# Patient Record
Sex: Female | Born: 1995 | Race: Black or African American | Hispanic: No | Marital: Single | Smoking: Never smoker
Health system: Southern US, Community
[De-identification: ages and names within clinical notes are randomized; demographics above are authoritative.]

---
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
## 2016-06-15 IMAGING — DX DG CHEST 2V
2 series · 2 of 2 positions shown · non-contrast
Comparison: None.

CLINICAL DATA: 19-year-old female with acute chest pain for 2 days.

EXAM:
CHEST  2 VIEW

[chest pa]
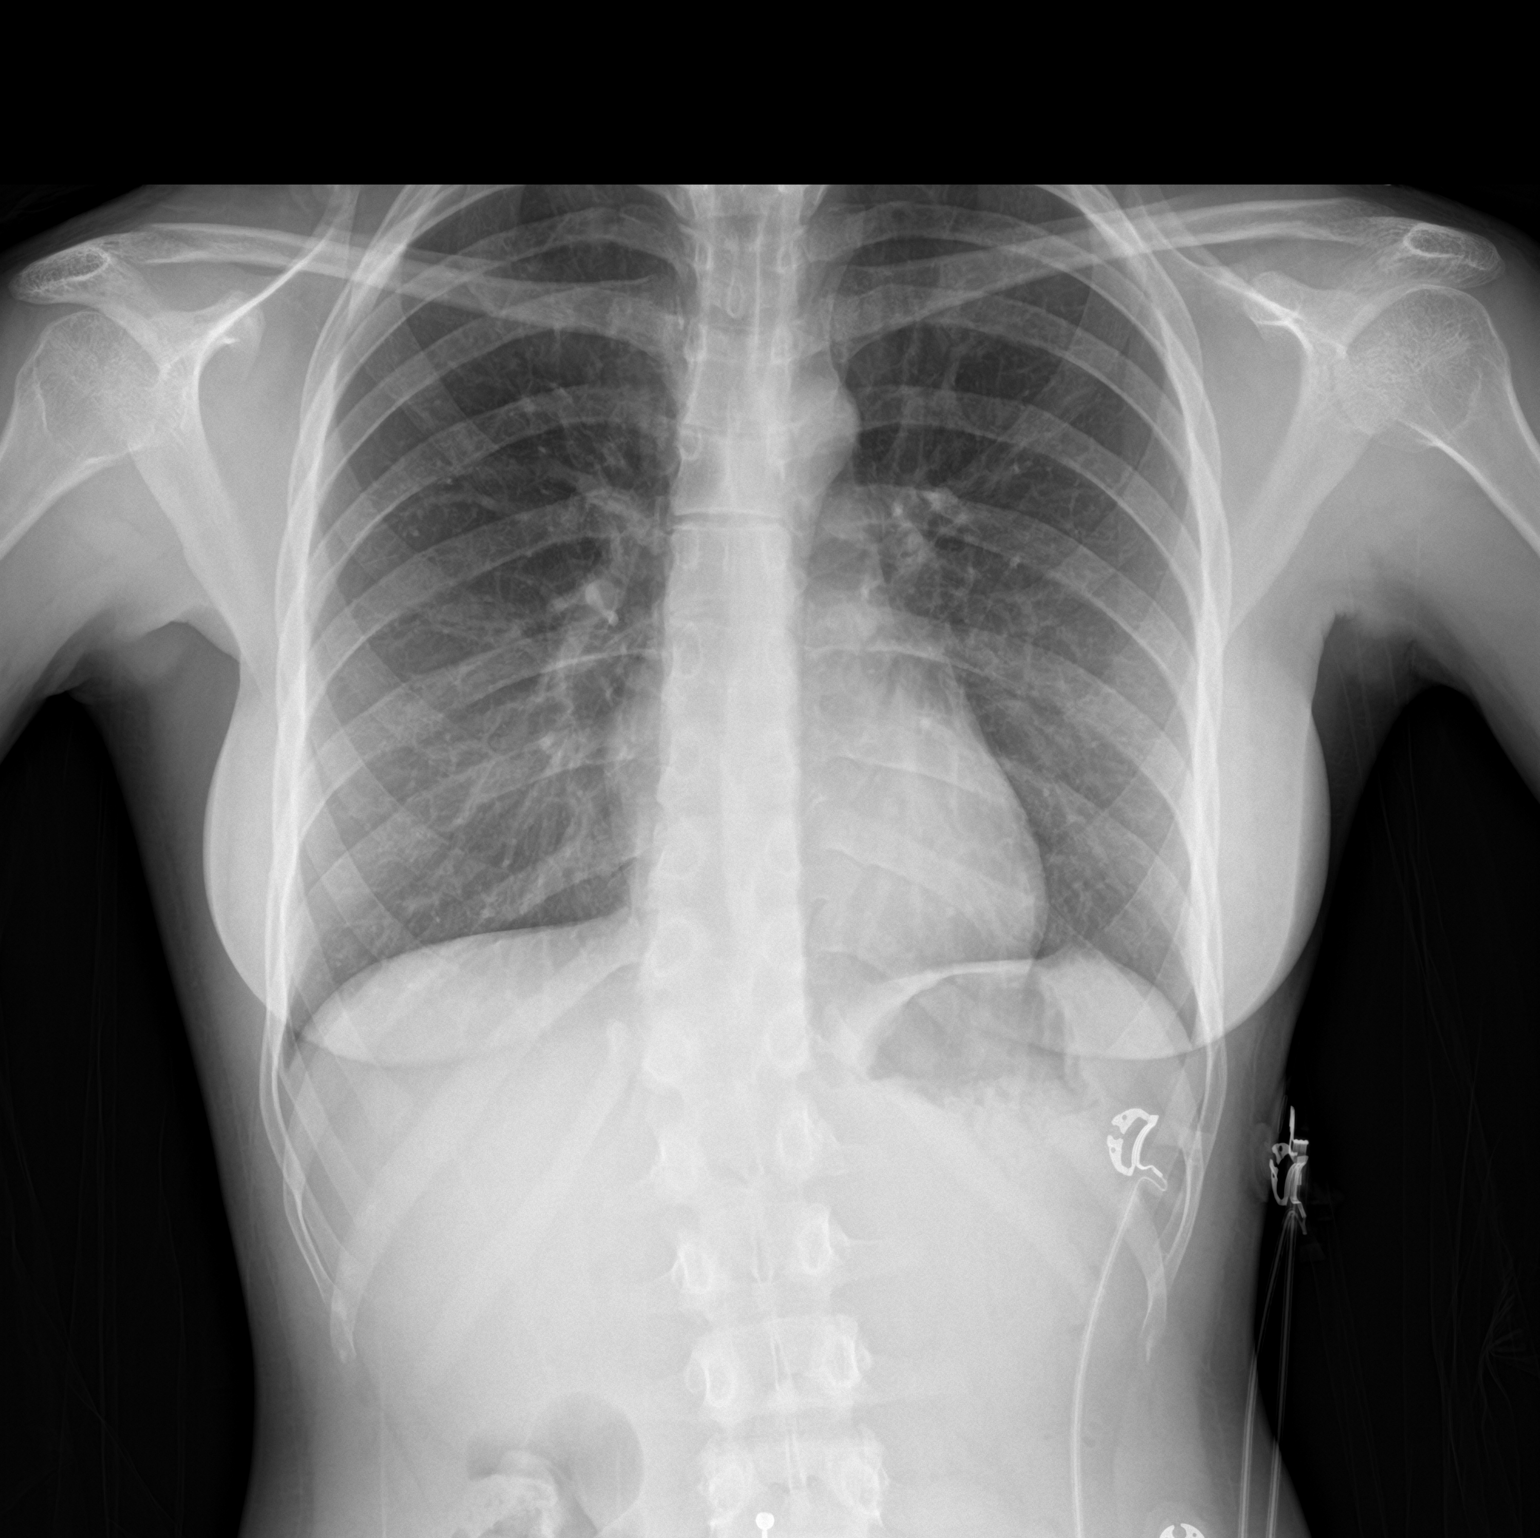

[chest lat]
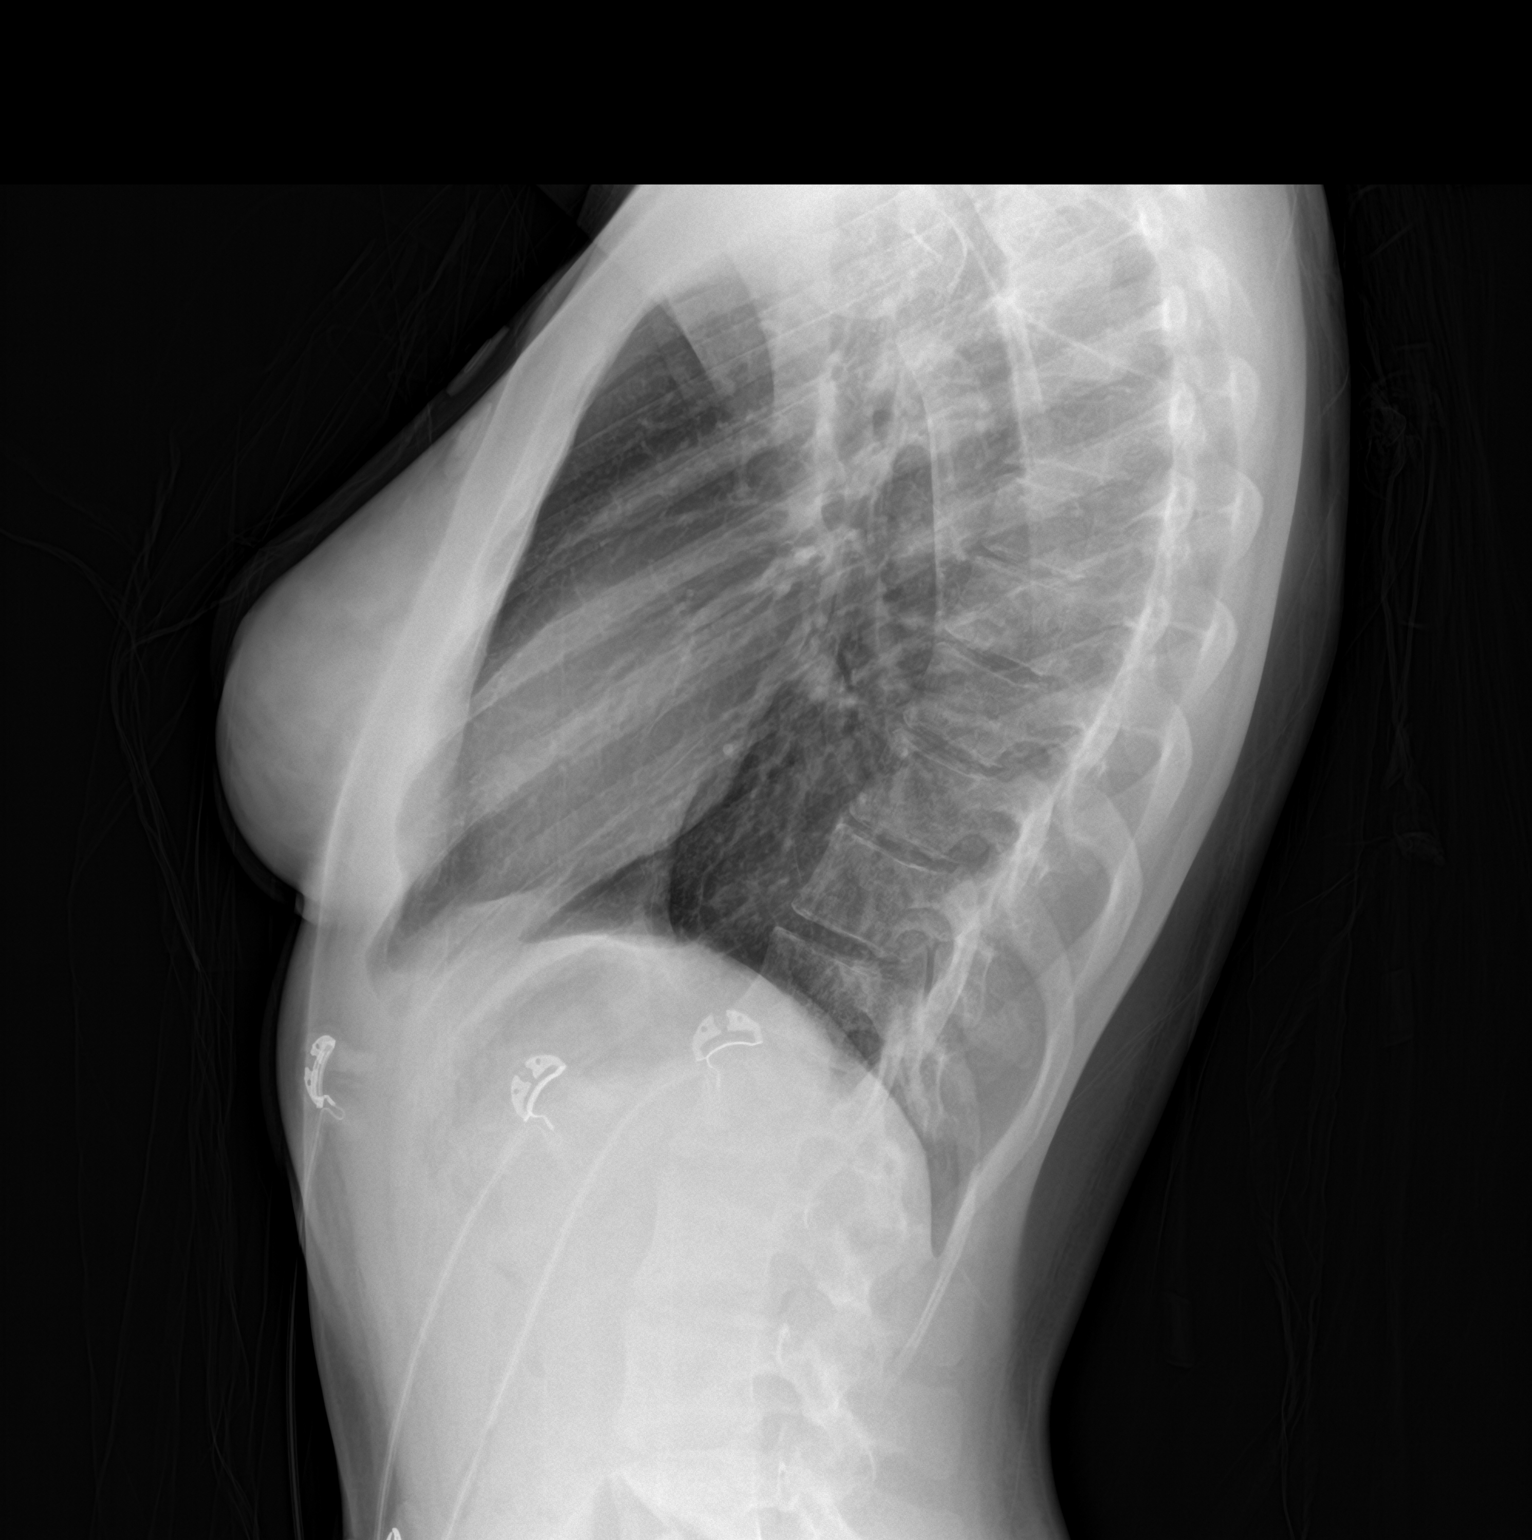

[2 of 2 positions shown; findings below may reference images not displayed]

FINDINGS: The cardiomediastinal silhouette is unremarkable.

There is no evidence of focal airspace disease, pulmonary edema,
suspicious pulmonary nodule/mass, pleural effusion, or pneumothorax.
No acute bony abnormalities are identified.
IMPRESSION: No active cardiopulmonary disease.

## 2016-10-11 ENCOUNTER — Encounter (HOSPITAL_COMMUNITY): Payer: Self-pay | Admitting: Emergency Medicine

## 2016-10-11 ENCOUNTER — Emergency Department (HOSPITAL_COMMUNITY)
Admission: EM | Admit: 2016-10-11 | Discharge: 2016-10-11 | Disposition: A | Discharge status: Home / Self Care | Payer: Managed Care, Other (non HMO) | Attending: Emergency Medicine | Admitting: Emergency Medicine

## 2016-10-11 DIAGNOSIS — J111 Influenza due to unidentified influenza virus with other respiratory manifestations: Secondary | ICD-10-CM | POA: Insufficient documentation

## 2016-10-11 DIAGNOSIS — R509 Fever, unspecified: Secondary | ICD-10-CM | POA: Diagnosis present

## 2016-10-11 DIAGNOSIS — R69 Illness, unspecified: Secondary | ICD-10-CM

## 2016-10-11 LAB — POC URINE PREG, ED: Preg Test, Ur: NEGATIVE

## 2016-10-11 LAB — RAPID STREP SCREEN (MED CTR MEBANE ONLY): STREPTOCOCCUS, GROUP A SCREEN (DIRECT): NEGATIVE

## 2016-10-11 MED ORDER — NAPROXEN 250 MG PO TABS: 250.0000 mg | ORAL_TABLET | Freq: Two times a day (BID) | ORAL | 0 refills | Status: DC

## 2016-10-11 MED ORDER — ONDANSETRON 4 MG PO TBDP
4.0000 mg | ORAL_TABLET | Freq: Three times a day (TID) | ORAL | 0 refills | Status: AC | PRN
Start: 2016-10-11 — End: ?

## 2016-10-11 MED ORDER — ACETAMINOPHEN 325 MG PO TABS
650.0000 mg | ORAL_TABLET | Freq: Once | ORAL | Status: DC
  Filled 2016-10-11: qty 2

## 2016-10-11 MED ORDER — FLUTICASONE PROPIONATE 50 MCG/ACT NA SUSP: 2.0000 | Freq: Every day | NASAL | 0 refills | Status: DC

## 2016-10-11 MED ORDER — BENZONATATE 100 MG PO CAPS: 100.0000 mg | ORAL_CAPSULE | Freq: Three times a day (TID) | ORAL | 0 refills | Status: DC | PRN

## 2016-10-11 MED ORDER — CETIRIZINE HCL 10 MG PO TABS: 10.0000 mg | ORAL_TABLET | Freq: Every day | ORAL | 1 refills | Status: DC

## 2016-10-11 MED ORDER — OSELTAMIVIR PHOSPHATE 75 MG PO CAPS: 75.0000 mg | ORAL_CAPSULE | Freq: Two times a day (BID) | ORAL | 0 refills | Status: DC

## 2016-10-11 NOTE — ED Provider Notes (Signed)
[facility]-EMERGENCY DEPT Provider Note   CSN: [id number] Arrival date & time: 10/11/16  0416     History   Chief Complaint Chief Complaint  Patient presents with  . Fever  . Sore Throat    HPI Rachel Spence is a 21 y.o. female.  Rachel Spence is a 21 y.o. Female who presents to the emergency department complaining of flulike symptoms since yesterday with nausea and vomiting beginning today. Patient reports subjective fever, chills, body aches, sore throat, nasal congestion, postnasal drip, sneezing and coughing. She reports today she developed some nausea and has vomited twice. She denies any abdominal pain. She's taken Mucinex earlier this morning. She denies taking any antipyretics prior to arrival. Patient tells me her vomiting seems to be worse in the morning and in the evening. She is sexually active and does not use protection. She is unsure of her last menstrual cycle she takes birth control and have regular menstrual cycles. Patient did not receive her flu shot this year. She denies trouble swallowing, neck pain, lightheadedness, syncope, rashes, urinary symptoms, abdominal pain, diarrhea, or shortness of breath.     Fever   Associated symptoms include vomiting, congestion, sore throat and cough. Pertinent negatives include no chest pain, no diarrhea and no headaches.  Sore Throat  Pertinent negatives include no chest pain, no abdominal pain, no headaches and no shortness of breath.    History reviewed. No pertinent past medical history.  There are no active problems to display for this patient.   History reviewed. No pertinent surgical history.  OB History    Gravida Para Term Preterm AB Living   0 0 0 0 0 0   SAB TAB Ectopic Multiple Live Births   0 0 0 0 0       Home Medications    Prior to Admission medications   Medication Sig Start Date End Date Taking? Authorizing Provider  benzonatate (TESSALON) 100 MG capsule Take 1 capsule (100 mg total) by mouth 3  (three) times daily as needed for cough. 10/11/16   Everlene FarrierWilliam Katelen Luepke, PA-C  cetirizine (ZYRTEC ALLERGY) 10 MG tablet Take 1 tablet (10 mg total) by mouth daily. 10/11/16   Everlene FarrierWilliam Cruze Zingaro, PA-C  fluticasone (FLONASE) 50 MCG/ACT nasal spray Place 2 sprays into both nostrils daily. 10/11/16   Everlene FarrierWilliam Corry Storie, PA-C  metroNIDAZOLE (FLAGYL) 500 MG tablet Take 500 mg by mouth 2 (two) times daily.    Historical Provider, MD  naproxen (NAPROSYN) 250 MG tablet Take 1 tablet (250 mg total) by mouth 2 (two) times daily with a meal. 10/11/16   Everlene FarrierWilliam Aadyn Buchheit, PA-C  ondansetron (ZOFRAN ODT) 4 MG disintegrating tablet Take 1 tablet (4 mg total) by mouth every 8 (eight) hours as needed for nausea or vomiting. 10/11/16   Everlene FarrierWilliam Kery Batzel, PA-C  oseltamivir (TAMIFLU) 75 MG capsule Take 1 capsule (75 mg total) by mouth every 12 (twelve) hours. 10/11/16   Everlene FarrierWilliam Sylvia Kondracki, PA-C    Family History History reviewed. No pertinent family history.  Social History Social History  Substance Use Topics  . Smoking status: Never Smoker  . Smokeless tobacco: Never Used  . Alcohol use No     Allergies   Patient has no known allergies.   Review of Systems Review of Systems  Constitutional: Positive for chills and fever.  HENT: Positive for congestion, postnasal drip, rhinorrhea, sneezing and sore throat. Negative for trouble swallowing.   Eyes: Negative for visual disturbance.  Respiratory: Positive for cough. Negative for shortness of breath and wheezing.  Cardiovascular: Negative for chest pain.  Gastrointestinal: Positive for nausea and vomiting. Negative for abdominal pain and diarrhea.  Genitourinary: Negative for decreased urine volume, difficulty urinating, dysuria and frequency.  Musculoskeletal: Negative for back pain and neck pain.  Skin: Negative for rash.  Neurological: Negative for syncope, light-headedness and headaches.     Physical Exam Updated Vital Signs BP 111/64 (BP Location: Right Arm)   Pulse 61    Temp 98.2 F (36.8 C) (Oral)   Resp 16   SpO2 100%   Physical Exam  Constitutional: She appears well-developed and well-nourished. No distress.  Nontoxic appearing.  HENT:  Head: Normocephalic and atraumatic.  Right Ear: External ear normal.  Left Ear: External ear normal.  Mouth/Throat: Oropharynx is clear and moist. No oropharyngeal exudate.  Mild middle ear effusion noted bilaterally without any erythema or loss of landmarks. Boggy nasal turbinates bilaterally. No tonsillar hypertrophy or exudates. Evidence of postnasal drip. Uvula is midline without edema. No peritonsillar abscess. No trismus. No drooling.  Eyes: Conjunctivae are normal. Pupils are equal, round, and reactive to light. Right eye exhibits no discharge. Left eye exhibits no discharge.  Neck: Normal range of motion. Neck supple. No JVD present.  Cardiovascular: Normal rate, regular rhythm, normal heart sounds and intact distal pulses.  Exam reveals no gallop and no friction rub.   No murmur heard. Pulmonary/Chest: Effort normal and breath sounds normal. No stridor. No respiratory distress. She has no wheezes. She has no rales.  Lungs clear to auscultation bilaterally. No increased work of breathing. No rales or rhonchi.  Abdominal: Soft. Bowel sounds are normal. She exhibits no distension and no mass. There is no tenderness. There is no guarding.  Abdomen is soft and nontender to palpation. Bowel sounds are present.  Musculoskeletal: She exhibits no edema.  Lymphadenopathy:    She has no cervical adenopathy.  Neurological: She is alert. Coordination normal.  Skin: Skin is warm and dry. Capillary refill takes less than 2 seconds. No rash noted. She is not diaphoretic. No erythema. No pallor.  Psychiatric: She has a normal mood and affect. Her behavior is normal.  Nursing note and vitals reviewed.    ED Treatments / Results  Labs (all labs ordered are listed, but only abnormal results are displayed) Labs Reviewed   RAPID STREP SCREEN (NOT AT [facility])  CULTURE, GROUP A STREP ([facility])  POC URINE PREG, ED    EKG  EKG Interpretation None       Radiology No results found.  Procedures Procedures (including critical care time)  Medications Ordered in ED Medications  acetaminophen (TYLENOL) tablet 650 mg (650 mg Oral Refused 10/11/16 0851)     Initial Impression / Assessment and Plan / ED Course  I have reviewed the triage vital signs and the nursing notes.  Pertinent labs & imaging results that were available during my care of the patient were reviewed by me and considered in my medical decision making (see chart for details).    This  is a 21 y.o. Female who presents to the emergency department complaining of flulike symptoms since yesterday with nausea and vomiting beginning today. Patient reports subjective fever, chills, body aches, sore throat, nasal congestion, postnasal drip, sneezing and coughing. She reports today she developed some nausea and has vomited twice. She denies any abdominal pain.  On exam the patient is afebrile nontoxic-appearing. Rhinorrhea is present. Mild middle ear effusion bilaterally. Throat is clear. Abdomen is soft and nontender palpation. Lungs are clear to auscultation bilaterally. Rapid  strep screen and urine pregnancy test were both negative. Patient with influenza-like illness. Offered patient Tamiflu as she has had symptom onset in the past 48 hours. She would like tamiflu. I discussed the possible side effects. I discussed the expected course and treatment of influenza. I encouraged her to orally hydrate. We'll discharge with prescriptions for naproxen, Zyrtec, Flonase, Zofran and Tessalon Perles. I advised the patient to follow-up with their primary care provider this week. I advised the patient to return to the emergency department with new or worsening symptoms or new concerns. The patient verbalized understanding and agreement with plan.      Final Clinical  Impressions(s) / ED Diagnoses   Final diagnoses:  Influenza-like illness    New Prescriptions New Prescriptions   BENZONATATE (TESSALON) 100 MG CAPSULE    Take 1 capsule (100 mg total) by mouth 3 (three) times daily as needed for cough.   CETIRIZINE (ZYRTEC ALLERGY) 10 MG TABLET    Take 1 tablet (10 mg total) by mouth daily.   FLUTICASONE (FLONASE) 50 MCG/ACT NASAL SPRAY    Place 2 sprays into both nostrils daily.   NAPROXEN (NAPROSYN) 250 MG TABLET    Take 1 tablet (250 mg total) by mouth 2 (two) times daily with a meal.   ONDANSETRON (ZOFRAN ODT) 4 MG DISINTEGRATING TABLET    Take 1 tablet (4 mg total) by mouth every 8 (eight) hours as needed for nausea or vomiting.   OSELTAMIVIR (TAMIFLU) 75 MG CAPSULE    Take 1 capsule (75 mg total) by mouth every 12 (twelve) hours.     Everlene Farrier, PA-C 10/11/16 4098    Tilden Fossa, MD 10/11/16 [phone]

## 2016-10-11 NOTE — ED Triage Notes (Signed)
Pt c/o flu like symptoms OTC med ineffective c/o fever and generalized body ache with cough

## 2016-10-13 LAB — CULTURE, GROUP A STREP (THRC)

## 2016-11-02 ENCOUNTER — Ambulatory Visit (HOSPITAL_COMMUNITY)
Admission: EM | Admit: 2016-11-02 | Discharge: 2016-11-02 | Disposition: A | Discharge status: Home / Self Care | Payer: Managed Care, Other (non HMO) | Attending: Family Medicine | Admitting: Family Medicine

## 2016-11-02 ENCOUNTER — Encounter (HOSPITAL_COMMUNITY): Payer: Self-pay | Admitting: Emergency Medicine

## 2016-11-02 DIAGNOSIS — J069 Acute upper respiratory infection, unspecified: Secondary | ICD-10-CM | POA: Diagnosis not present

## 2016-11-02 MED ORDER — IPRATROPIUM BROMIDE 0.06 % NA SOLN: 2.0000 | Freq: Four times a day (QID) | NASAL | 2 refills | Status: DC

## 2016-11-02 NOTE — ED Provider Notes (Signed)
[facility]    CSN: [id number] Arrival date & time: 11/02/16  1717     History   Chief Complaint Chief Complaint  Patient presents with  . URI    HPI Rachel Spence is a 21 y.o. female.   The history is provided by the patient.  URI  Presenting symptoms: congestion, cough and rhinorrhea   Presenting symptoms: no fever   Severity:  Mild Onset quality:  Gradual Duration:  1 week Progression:  Unchanged Chronicity:  New Relieved by:  None tried Worsened by:  Nothing Ineffective treatments:  None tried Associated symptoms: no wheezing   Risk factors: sick contacts     History reviewed. No pertinent past medical history.  There are no active problems to display for this patient.   History reviewed. No pertinent surgical history.  OB History    Gravida Para Term Preterm AB Living   0 0 0 0 0 0   SAB TAB Ectopic Multiple Live Births   0 0 0 0 0       Home Medications    Prior to Admission medications   Medication Sig Start Date End Date Taking? Authorizing Provider  fluticasone (FLONASE) 50 MCG/ACT nasal spray Place 2 sprays into both nostrils daily. 10/11/16  Yes Everlene FarrierWilliam Dansie, PA-C  benzonatate (TESSALON) 100 MG capsule Take 1 capsule (100 mg total) by mouth 3 (three) times daily as needed for cough. 10/11/16   Everlene FarrierWilliam Dansie, PA-C  cetirizine (ZYRTEC ALLERGY) 10 MG tablet Take 1 tablet (10 mg total) by mouth daily. 10/11/16   Everlene FarrierWilliam Dansie, PA-C  ipratropium (ATROVENT) 0.06 % nasal spray Place 2 sprays into both nostrils 4 (four) times daily. 11/02/16   Linna HoffJames D Hanaa Payes, MD  metroNIDAZOLE (FLAGYL) 500 MG tablet Take 500 mg by mouth 2 (two) times daily.    Historical Provider, MD  naproxen (NAPROSYN) 250 MG tablet Take 1 tablet (250 mg total) by mouth 2 (two) times daily with a meal. 10/11/16   Everlene FarrierWilliam Dansie, PA-C  ondansetron (ZOFRAN ODT) 4 MG disintegrating tablet Take 1 tablet (4 mg total) by mouth every 8 (eight) hours as needed for nausea or vomiting.  10/11/16   Everlene FarrierWilliam Dansie, PA-C  oseltamivir (TAMIFLU) 75 MG capsule Take 1 capsule (75 mg total) by mouth every 12 (twelve) hours. 10/11/16   Everlene FarrierWilliam Dansie, PA-C    Family History No family history on file.  Social History Social History  Substance Use Topics  . Smoking status: Never Smoker  . Smokeless tobacco: Never Used  . Alcohol use No     Allergies   Patient has no known allergies.   Review of Systems Review of Systems  Constitutional: Negative for fever.  HENT: Positive for congestion, postnasal drip and rhinorrhea.   Respiratory: Positive for cough. Negative for shortness of breath and wheezing.   Cardiovascular: Negative.   Gastrointestinal: Negative.   Genitourinary: Negative.   All other systems reviewed and are negative.    Physical Exam Triage Vital Signs ED Triage Vitals [11/02/16 1738]  Enc Vitals Group     BP 144/71     Pulse Rate 104     Resp 16     Temp 99.1 F (37.3 C)     Temp Source Oral     SpO2 100 %     Weight      Height      Head Circumference      Peak Flow      Pain Score  Pain Loc      Pain Edu?      Excl. in GC?    No data found.   Updated Vital Signs BP 144/71 (BP Location: Left Arm)   Pulse 104   Temp 99.1 F (37.3 C) (Oral)   Resp 16   SpO2 100%   Visual Acuity Right Eye Distance:   Left Eye Distance:   Bilateral Distance:    Right Eye Near:   Left Eye Near:    Bilateral Near:     Physical Exam  Constitutional: She is oriented to person, place, and time. She appears well-developed and well-nourished.  HENT:  Right Ear: External ear normal.  Left Ear: External ear normal.  Nose: Nose normal.  Mouth/Throat: Oropharynx is clear and moist.  Eyes: Pupils are equal, round, and reactive to light.  Neck: Normal range of motion. Neck supple.  Cardiovascular: Normal rate and regular rhythm.   Pulmonary/Chest: Effort normal and breath sounds normal.  Lymphadenopathy:    She has no cervical adenopathy.    Neurological: She is alert and oriented to person, place, and time.  Skin: Skin is warm and dry.  Nursing note and vitals reviewed.    UC Treatments / Results  Labs (all labs ordered are listed, but only abnormal results are displayed) Labs Reviewed - No data to display  EKG  EKG Interpretation None       Radiology No results found.  Procedures Procedures (including critical care time)  Medications Ordered in UC Medications - No data to display   Initial Impression / Assessment and Plan / UC Course  I have reviewed the triage vital signs and the nursing notes.  Pertinent labs & imaging results that were available during my care of the patient were reviewed by me and considered in my medical decision making (see chart for details).       Final Clinical Impressions(s) / UC Diagnoses   Final diagnoses:  Upper respiratory tract infection, unspecified type    New Prescriptions New Prescriptions   IPRATROPIUM (ATROVENT) 0.06 % NASAL SPRAY    Place 2 sprays into both nostrils 4 (four) times daily.     Linna Hoff, MD 11/02/16 [phone]

## 2016-11-02 NOTE — ED Triage Notes (Signed)
Pt c/o cold sx onset: 6 days  Sx include: weakness, prod cough, nasal congestion/drainage, HA  Denies: fevers  Taking: OTC cold meds w/temp relief.   A&O x4... NAD

## 2017-08-02 ENCOUNTER — Other Ambulatory Visit: Payer: Self-pay

## 2017-08-02 ENCOUNTER — Ambulatory Visit (HOSPITAL_COMMUNITY)
Admission: EM | Admit: 2017-08-02 | Discharge: 2017-08-02 | Disposition: A | Discharge status: Home / Self Care | Payer: Managed Care, Other (non HMO) | Attending: Internal Medicine | Admitting: Internal Medicine

## 2017-08-02 ENCOUNTER — Encounter (HOSPITAL_COMMUNITY): Payer: Self-pay | Admitting: Emergency Medicine

## 2017-08-02 DIAGNOSIS — R21 Rash and other nonspecific skin eruption: Secondary | ICD-10-CM | POA: Diagnosis present

## 2017-08-02 DIAGNOSIS — N898 Other specified noninflammatory disorders of vagina: Secondary | ICD-10-CM

## 2017-08-02 DIAGNOSIS — Z79899 Other long term (current) drug therapy: Secondary | ICD-10-CM | POA: Insufficient documentation

## 2017-08-02 MED ORDER — FLUCONAZOLE 150 MG PO TABS: 150.0000 mg | ORAL_TABLET | Freq: Every day | ORAL | 0 refills | Status: AC

## 2017-08-02 NOTE — ED Provider Notes (Signed)
[facility]    CSN: [id number] Arrival date & time: 08/02/17  1558     History   Chief Complaint Chief Complaint  Patient presents with  . Rash  . Vaginal Discharge    HPI Rachel Spence is a 21 y.o. female.   21 year old female comes in for 1 day history of rash on the vagina. She also noticed some white discharge 2 days ago, prior to cycle onset. She has had some vaginal itching, irritation, discharge. She first noticed the rash after using otc miconazole cream. Shaved yesterday with new soap product. States rash is slightly irritating, denies pain, spreading redness.  Denies pain, fever. Denies urinary symptoms such as frequency, dysuria, hematuria. Denies abdominal pain, nausea, vomiting. Sexually active with 1 female partner, no condom use. Nexplanon implant. Denies history of HSV.       History reviewed. No pertinent past medical history.  There are no active problems to display for this patient.   History reviewed. No pertinent surgical history.  OB History    Gravida Para Term Preterm AB Living   0 0 0 0 0 0   SAB TAB Ectopic Multiple Live Births   0 0 0 0 0       Home Medications    Prior to Admission medications   Medication Sig Start Date End Date Taking? Authorizing Provider  fluconazole (DIFLUCAN) 150 MG tablet Take 1 tablet (150 mg total) daily by mouth. Take second dose 72 hours later if symptoms still persists. 08/02/17   Cathie HoopsYu, Nicolena Schurman V, PA-C  ondansetron (ZOFRAN ODT) 4 MG disintegrating tablet Take 1 tablet (4 mg total) by mouth every 8 (eight) hours as needed for nausea or vomiting. 10/11/16   Everlene Farrieransie, William, PA-C    Family History No family history on file.  Social History Social History   Tobacco Use  . Smoking status: Never Smoker  . Smokeless tobacco: Never Used  Substance Use Topics  . Alcohol use: No  . Drug use: No     Allergies   Miconazole nitrate   Review of Systems Review of Systems  Reason unable to perform ROS:  See HPI as above.     Physical Exam Triage Vital Signs ED Triage Vitals  Enc Vitals Group     BP 08/02/17 1620 (!) 160/75     Pulse Rate 08/02/17 1620 (S) (!) 117     Resp 08/02/17 1620 18     Temp 08/02/17 1620 98.7 F (37.1 C)     Temp src --      SpO2 08/02/17 1620 100 %     Weight --      Height --      Head Circumference --      Peak Flow --      Pain Score 08/02/17 1621 0     Pain Loc --      Pain Edu? --      Excl. in GC? --    No data found.  Updated Vital Signs BP (!) 160/75   Pulse (S) (!) 117 Comment: pt stated she was nervous  Temp 98.7 F (37.1 C)   Resp 18   LMP 07/31/2017   SpO2 100%   Physical Exam  Constitutional: She is oriented to person, place, and time. She appears well-developed and well-nourished. No distress.  HENT:  Head: Normocephalic and atraumatic.  Eyes: Conjunctivae are normal. Pupils are equal, round, and reactive to light.  Cardiovascular: Normal rate, regular rhythm and normal  heart sounds. Exam reveals no gallop and no friction rub.  No murmur heard. Pulmonary/Chest: Effort normal and breath sounds normal. She has no wheezes. She has no rales.  Abdominal: Soft. Bowel sounds are normal. She exhibits no mass. There is no tenderness. There is no rebound, no guarding and no CVA tenderness.  Genitourinary:    There is rash on the right labia. There is no tenderness on the right labia. There is rash on the left labia. There is no tenderness on the left labia.  Genitourinary Comments: No tenderness on palpation of maculopapular rash. No ulceration/vesicle formation.   Speculum exam deferred. Vaginal cytology obtained.   Neurological: She is alert and oriented to person, place, and time.  Skin: Skin is warm and dry.  Psychiatric: She has a normal mood and affect. Her behavior is normal. Judgment normal.     UC Treatments / Results  Labs (all labs ordered are listed, but only abnormal results are displayed) Labs Reviewed    CERVICOVAGINAL ANCILLARY ONLY    EKG  EKG Interpretation None       Radiology No results found.  Procedures Procedures (including critical care time)  Medications Ordered in UC Medications - No data to display   Initial Impression / Assessment and Plan / UC Course  I have reviewed the triage vital signs and the nursing notes.  Pertinent labs & imaging results that were available during my care of the patient were reviewed by me and considered in my medical decision making (see chart for details).    Patient was treated empirically for yeast. Diflucan as directed. Discontinue new products. Cytology sent, patient will be contacted with any positive results that require additional treatment. Patient to refrain from sexual activity for the next 7 days. Follow up with GYN if rash does not resolve. Return precautions given.    Final Clinical Impressions(s) / UC Diagnoses   Final diagnoses:  Vaginal discharge    ED Discharge Orders        Ordered    fluconazole (DIFLUCAN) 150 MG tablet  Daily     08/02/17 1729        Belinda FisherYu, Kenzo Ozment V, PA-C 08/02/17 1737

## 2017-08-02 NOTE — ED Notes (Signed)
Call back number verified and updated in EPIC... Adv pt to not have SI until lab results comeback neg.... Also adv pt lab results will be on MyChart; instructions given .... Pt verb understanding.   

## 2017-08-02 NOTE — Discharge Instructions (Addendum)
You were treated empirically for yeast. Start diflucan as directed. Cytology sent, you will be contacted with any positive results that requires further treatment. Discontinue new exposures that could cause the rash. Refrain from sexual activity for the next 7 days. Monitor for any worsening of symptoms, fever, abdominal pain, nausea, vomiting, to follow up for reevaluation.  If rash does not resolve, please follow up with GYN for further evaluation and treatment.

## 2017-08-02 NOTE — ED Triage Notes (Signed)
Pt c/o "bumps around my vaginal on the inside, I also think I have a yeast infection". Pt c/o vaginal itchiness, irritation and discharge.

## 2017-08-05 ENCOUNTER — Telehealth (HOSPITAL_COMMUNITY): Payer: Self-pay | Admitting: Internal Medicine

## 2017-08-05 LAB — CERVICOVAGINAL ANCILLARY ONLY
BACTERIAL VAGINITIS: POSITIVE — AB
Candida vaginitis: NEGATIVE
Chlamydia: NEGATIVE
Neisseria Gonorrhea: NEGATIVE
Trichomonas: NEGATIVE

## 2017-08-05 MED ORDER — METRONIDAZOLE 500 MG PO TABS: 500.0000 mg | ORAL_TABLET | Freq: Two times a day (BID) | ORAL | 0 refills | Status: AC

## 2017-08-05 NOTE — Telephone Encounter (Signed)
Please let patient know that test for gardnerella (bacterial vaginosis) was positive.  Will send rx for metronidazole to pharmacy of record, [facility] [address] [facility] [place].  Recheck for further evaluation if symptoms are not improving.  LM

## 2017-10-19 ENCOUNTER — Ambulatory Visit: Payer: Self-pay | Admitting: Nurse Practitioner

## 2017-10-19 ENCOUNTER — Ambulatory Visit (HOSPITAL_COMMUNITY)
Admission: EM | Admit: 2017-10-19 | Discharge: 2017-10-19 | Discharge status: Absent without leave | Payer: Managed Care, Other (non HMO)

## 2017-10-19 ENCOUNTER — Encounter: Payer: Self-pay | Admitting: Nurse Practitioner

## 2017-10-19 VITALS — BP 130/90 | HR 111 | Temp 98.9°F | Wt 147.6 lb

## 2017-10-19 DIAGNOSIS — J069 Acute upper respiratory infection, unspecified: Secondary | ICD-10-CM

## 2017-10-19 MED ORDER — FLUTICASONE PROPIONATE 50 MCG/ACT NA SUSP: 2.0000 | Freq: Every day | NASAL | 0 refills | Status: AC

## 2017-10-19 MED ORDER — FLUTICASONE PROPIONATE 50 MCG/ACT NA SUSP
2.0000 | Freq: Every day | NASAL | 0 refills | Status: DC
Start: 2017-10-19 — End: 2017-10-19

## 2017-10-19 NOTE — Patient Instructions (Addendum)
Viral Respiratory Infection A respiratory infection is an illness that affects part of the respiratory system, such as the lungs, nose, or throat. Most respiratory infections are caused by either viruses or bacteria. A respiratory infection that is caused by a virus is called a viral respiratory infection. Common types of viral respiratory infections include:  A cold.  The flu (influenza).  A respiratory syncytial virus (RSV) infection.  How do I know if I have a viral respiratory infection? Most viral respiratory infections cause:  A stuffy or runny nose.  Yellow or green nasal discharge.  A cough.  Sneezing.  Fatigue.  Achy muscles.  A sore throat.  Sweating or chills.  A fever.  A headache.  How are viral respiratory infections treated? If influenza is diagnosed early, it may be treated with an antiviral medicine that shortens the length of time a person has symptoms. Symptoms of viral respiratory infections may be treated with over-the-counter and prescription medicines, such as:  Expectorants. These make it easier to cough up mucus.  Decongestant nasal sprays.  Health care providers do not prescribe antibiotic medicines for viral infections. This is because antibiotics are designed to kill bacteria. They have no effect on viruses. How do I know if I should stay home from work or school? To avoid exposing others to your respiratory infection, stay home if you have:  A fever.  A persistent cough.  A sore throat.  A runny nose.  Sneezing.  Muscles aches.  Headaches.  Fatigue.  Weakness.  Chills.  Sweating.  Nausea.  Follow these instructions at home:  Rest as much as possible.  Take over-the-counter and prescription medicines only as told by your health care provider.  Drink enough fluid to keep your urine clear or pale yellow. This helps prevent dehydration and helps loosen up mucus.  Gargle with a salt-water mixture 3-4 times per day or  as needed. To make a salt-water mixture, completely dissolve -1 tsp of salt in 1 cup of warm water.  Use nose drops made from salt water to ease congestion and soften raw skin around your nose.  Do not drink alcohol.  Do not use tobacco products, including cigarettes, chewing tobacco, and e-cigarettes. If you need help quitting, ask your health care provider.  Use Flonase Nasal Spray, two sprays in each nostril daily for ten days.  Take Zyrtec, one tablet in the morning for 10 days.  May take Benadryl  25mg  at bedtime, if needed.  Contact a health care provider if:  Your symptoms last for 10 days or longer.  Your symptoms get worse over time.  You have a fever.  You have severe sinus pain in your face or forehead.  The glands in your jaw or neck become very swollen. Get help right away if:  You feel pain or pressure in your chest.  You have shortness of breath.  You faint or feel like you will faint.  You have severe and persistent vomiting.  You feel confused or disoriented. This information is not intended to replace advice given to you by your health care provider. Make sure you discuss any questions you have with your health care provider. Document Released: 06/13/2005 Document Revised: 02/09/2016 Document Reviewed: 02/09/2015 Elsevier Interactive Patient Education  [employer]2018 [employer]

## 2017-10-19 NOTE — Progress Notes (Signed)
Subjective:  Rachel Spence is a 22 y.o. female who presents for evaluation of URI like symptoms.  Symptoms include bilateral ear pressure/pain, headache described as dull, nasal congestion, post nasal drip and productive cough with white colored sputum.  Onset of symptoms was 3 days ago, and has been gradually worsening since that time.  Treatment to date:  decongestants.  High risk factors for influenza complications:  patient is a [employer] student.  The following portions of the patient's history were reviewed and updated as appropriate:  allergies, current medications and past medical history.  Constitutional: positive for chills and fatigue, negative for anorexia, malaise and sweats Eyes: negative Ears, nose, mouth, throat, and face: positive for nasal congestion, sore throat and ear pressure, itching, negative for ear drainage, hearing loss and hoarseness Respiratory: positive for cough, negative for asthma, sputum, stridor and wheezing Cardiovascular: negative Gastrointestinal: negative Neurological: positive for headaches, negative for coordination problems, dizziness, vertigo and weakness Allergic/Immunologic: negative Objective:  BP 130/90   Pulse (!) 111   Temp 98.9 F (37.2 C)   Wt 147 lb 9.6 oz (67 kg)   SpO2 97%  General appearance: alert, cooperative, fatigued and no distress Head: Normocephalic, without obvious abnormality, atraumatic Eyes: conjunctivae/corneas clear. PERRL, EOM's intact. Fundi benign. Ears: abnormal TM right ear - mucoid middle ear fluid and abnormal TM left ear - mucoid middle ear fluid Nose: clear discharge, moderate congestion, turbinates swollen, inflamed, no sinus tenderness Throat: abnormal findings: mild oropharyngeal erythema and uvula midline. no exudates Lungs: clear to auscultation bilaterally Heart: regular rate and rhythm, S1, S2 normal, no murmur, click, rub or gallop Pulses: 2+ and symmetric Skin: Skin color, texture, turgor normal. No  rashes or lesions Lymph nodes: cervical nodes normal Neurologic: Grossly normal    Assessment:  viral upper respiratory illness    Plan:  Discussed diagnosis and treatment of URI. Discussed the importance of avoiding unnecessary antibiotic therapy. Educational material distributed and questions answered. Suggested symptomatic OTC remedies. Supportive care with appropriate antipyretics and fluids. Nasal saline spray for congestion. Nasal steroids per orders. Follow up as needed.

## 2017-10-24 ENCOUNTER — Telehealth: Payer: Self-pay

## 2017-11-15 ENCOUNTER — Other Ambulatory Visit: Payer: Self-pay | Admitting: Nurse Practitioner

## 2019-10-27 NOTE — Telephone Encounter (Signed)
Error
